# Patient Record
Sex: Male | Born: 1970 | Race: Black or African American | Hispanic: No | Marital: Married | Smoking: Current every day smoker
Health system: Southern US, Community
[De-identification: ages and names within clinical notes are randomized; demographics above are authoritative.]

---

## 2015-06-08 ENCOUNTER — Emergency Department (HOSPITAL_COMMUNITY)
Admission: EM | Admit: 2015-06-08 | Discharge: 2015-06-08 | Disposition: A | Discharge status: Home / Self Care | Payer: Medicaid Other | Attending: Emergency Medicine | Admitting: Emergency Medicine

## 2015-06-08 ENCOUNTER — Encounter (HOSPITAL_COMMUNITY): Payer: Self-pay | Admitting: Emergency Medicine

## 2015-06-08 DIAGNOSIS — K029 Dental caries, unspecified: Secondary | ICD-10-CM | POA: Diagnosis not present

## 2015-06-08 DIAGNOSIS — K0381 Cracked tooth: Secondary | ICD-10-CM | POA: Diagnosis not present

## 2015-06-08 DIAGNOSIS — K047 Periapical abscess without sinus: Secondary | ICD-10-CM | POA: Insufficient documentation

## 2015-06-08 DIAGNOSIS — Z720 Tobacco use: Secondary | ICD-10-CM | POA: Insufficient documentation

## 2015-06-08 DIAGNOSIS — K0889 Other specified disorders of teeth and supporting structures: Secondary | ICD-10-CM | POA: Diagnosis present

## 2015-06-08 MED ORDER — CLINDAMYCIN PHOSPHATE 600 MG/50ML IV SOLN
600.0000 mg | Freq: Once | INTRAVENOUS | Status: AC
  Administered 2015-06-08: 600 mg via INTRAVENOUS
  Filled 2015-06-08: qty 50

## 2015-06-08 MED ORDER — OXYCODONE-ACETAMINOPHEN 5-325 MG PO TABS: 1.0000 | ORAL_TABLET | ORAL | Status: AC | PRN

## 2015-06-08 MED ORDER — OXYCODONE-ACETAMINOPHEN 5-325 MG PO TABS
1.0000 | ORAL_TABLET | Freq: Once | ORAL | Status: AC
  Administered 2015-06-08: 1 via ORAL
  Filled 2015-06-08: qty 1

## 2015-06-08 MED ORDER — CLINDAMYCIN HCL 150 MG PO CAPS: 300.0000 mg | ORAL_CAPSULE | Freq: Four times a day (QID) | ORAL | Status: DC

## 2015-06-08 MED ORDER — CLINDAMYCIN HCL 150 MG PO CAPS: 300.0000 mg | ORAL_CAPSULE | Freq: Four times a day (QID) | ORAL | Status: AC

## 2015-06-08 MED ORDER — OXYCODONE-ACETAMINOPHEN 5-325 MG PO TABS: 1.0000 | ORAL_TABLET | ORAL | Status: DC | PRN

## 2015-06-08 NOTE — Discharge Instructions (Signed)
Dental Abscess A dental abscess is pus in or around a tooth. HOME CARE  Take medicines only as told by your dentist.  If you were prescribed antibiotic medicine, finish all of it even if you start to feel better.  Rinse your mouth (gargle) often with salt water.  Do not drive or use heavy machinery, like a lawn mower, while taking pain medicine.  Do not apply heat to the outside of your mouth.  Keep all follow-up visits as told by your dentist. This is important. GET HELP IF:  Your pain is worse, and medicine does not help. GET HELP RIGHT AWAY IF:  You have a fever or chills.  Your symptoms suddenly get worse.  You have a very bad headache.  You have problems breathing or swallowing.  You have trouble opening your mouth.  You have puffiness (swelling) in your neck or around your eye.   This information is not intended to replace advice given to you by your health care provider. Make sure you discuss any questions you have with your health care provider.   Document Released: 12/17/2014 Document Reviewed: 12/17/2014 Elsevier Interactive Patient Education 2016 [employer]  

## 2015-06-08 NOTE — ED Provider Notes (Signed)
CSN: [id number]     Arrival date & time 06/08/15  0911 History   First MD Initiated Contact with Patient 06/08/15 0913     Chief Complaint  Patient presents with  . Dental Pain     (Consider location/radiation/quality/duration/timing/severity/associated sxs/prior Treatment) HPI   Brent Santos is a 44 y.o. male who presents to the Emergency Department complaining of right lower tooth pain for three days.  He reports having a "broken tooth" for a long time and has not seen a dentist.  Developed throbbing pain and noticed swelling of his face three days ago.  He complains of pain with opening his mouth, and pain radiating into his jaw and along side his nose and upper lip.  He has taken ibuprofen and used Orajel without relief.  He denies fever, difficulty breathing or swallowing, neck pain and chills.   History reviewed. No pertinent past medical history. History reviewed. No pertinent past surgical history. History reviewed. No pertinent family history. Social History  Substance Use Topics  . Smoking status: Current Every Day Smoker  . Smokeless tobacco: None  . Alcohol Use: No    Review of Systems  Constitutional: Negative for fever and appetite change.  HENT: Positive for dental problem. Negative for congestion, facial swelling, sore throat and trouble swallowing.   Eyes: Negative for pain and visual disturbance.  Musculoskeletal: Negative for neck pain and neck stiffness.  Neurological: Negative for dizziness, facial asymmetry and headaches.  Hematological: Negative for adenopathy.  All other systems reviewed and are negative.     Allergies  Review of patient's allergies indicates no known allergies.  Home Medications   Prior to Admission medications   Not on File   BP 134/72 mmHg  Pulse 81  Temp(Src) 98.6 F (37 C) (Oral)  Resp 14  Ht 5\' 11"  (1.803 m)  Wt 190 lb (86.183 kg)  BMI 26.51 kg/m2  SpO2 100% Physical Exam  Constitutional: He is oriented to  person, place, and time. He appears well-developed and well-nourished. No distress.  HENT:  Head: Normocephalic and atraumatic.  Right Ear: Tympanic membrane and ear canal normal.  Left Ear: Tympanic membrane and ear canal normal.  Mouth/Throat: Uvula is midline, oropharynx is clear and moist and mucous membranes are normal. No trismus in the jaw. Dental abscesses and dental caries present. No uvula swelling.  Dental decay and tenderness of the right lower second premolar.  Moderate facial swelling, no obvious dental abscess, no trismus or sublingual abnml.    Neck: Normal range of motion. Neck supple.  Cardiovascular: Normal rate, regular rhythm and normal heart sounds.   No murmur heard. Pulmonary/Chest: Effort normal and breath sounds normal. No respiratory distress.  Musculoskeletal: Normal range of motion.  Lymphadenopathy:    He has no cervical adenopathy.  Neurological: He is alert and oriented to person, place, and time. He exhibits normal muscle tone. Coordination normal.  Skin: Skin is warm and dry.  Nursing note and vitals reviewed.   ED Course  Procedures (including critical care time)   MDM   Final diagnoses:  Dental abscess    Pt is well appearing, vitals stable , airway patent.  No concerns for Ludwig's angina.  Given IV clinda and he agrees to close dental f/u, rx for po clinda and #12 percocet.  Facial edema w/o drainable dental abscess    Brent Ausammy Kahil Agner, PA-C 06/09/15 2056  Jerelyn ScottMartha Linker, MD 06/17/15 [phone]

## 2015-06-08 NOTE — ED Notes (Signed)
Patient complaining of pain and swelling to right lower jaw since Thursday. States he has a tooth that is chipped off in that area.

## 2016-01-04 ENCOUNTER — Encounter (HOSPITAL_COMMUNITY): Payer: Self-pay

## 2016-01-04 ENCOUNTER — Emergency Department (HOSPITAL_COMMUNITY)
Admission: EM | Admit: 2016-01-04 | Discharge: 2016-01-04 | Disposition: A | Discharge status: Home / Self Care | Payer: Medicaid Other | Attending: Emergency Medicine | Admitting: Emergency Medicine

## 2016-01-04 DIAGNOSIS — F17210 Nicotine dependence, cigarettes, uncomplicated: Secondary | ICD-10-CM | POA: Insufficient documentation

## 2016-01-04 DIAGNOSIS — Z791 Long term (current) use of non-steroidal anti-inflammatories (NSAID): Secondary | ICD-10-CM | POA: Diagnosis not present

## 2016-01-04 DIAGNOSIS — K047 Periapical abscess without sinus: Secondary | ICD-10-CM | POA: Insufficient documentation

## 2016-01-04 DIAGNOSIS — K0889 Other specified disorders of teeth and supporting structures: Secondary | ICD-10-CM | POA: Diagnosis present

## 2016-01-04 MED ORDER — AMOXICILLIN 500 MG PO CAPS: 500.0000 mg | ORAL_CAPSULE | Freq: Three times a day (TID) | ORAL | Status: AC

## 2016-01-04 MED ORDER — AMOXICILLIN 250 MG PO CAPS
500.0000 mg | ORAL_CAPSULE | Freq: Once | ORAL | Status: AC
  Administered 2016-01-04: 500 mg via ORAL
  Filled 2016-01-04: qty 2

## 2016-01-04 MED ORDER — HYDROCODONE-ACETAMINOPHEN 5-325 MG PO TABS
1.0000 | ORAL_TABLET | Freq: Once | ORAL | Status: AC
  Administered 2016-01-04: 1 via ORAL
  Filled 2016-01-04: qty 1

## 2016-01-04 MED ORDER — HYDROCODONE-ACETAMINOPHEN 5-325 MG PO TABS: 1.0000 | ORAL_TABLET | ORAL | Status: DC | PRN

## 2016-01-04 NOTE — Discharge Instructions (Signed)
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
## 2016-01-04 NOTE — ED Notes (Signed)
Having dental pain, possible abscess, that started on Friday.

## 2016-01-04 NOTE — ED Provider Notes (Signed)
CSN: [id number]     Arrival date & time 01/04/16  2112 History  By signing my name below, I, Brent Santos, attest that this documentation has been prepared under the direction and in the presence of [name], PA-C.   Electronically Signed: [name], ED Scribe 01/06/2016 at 11:22 PM.   Chief Complaint  Patient presents with  . Dental Problem    The history is provided by the patient. No language interpreter was used.   HPI Comments: [name] is a 45 y.o. male who presents to the Emergency Department complaining of gradual onset, lower right dental pain, ongoing for three days. He states he believes he has an abscess in the area. No other associated symptoms noted. Pt has taken ibuprofen PTA with no relief to symptoms. No other worsening or alleviating factors noted. Pt denies drainage, fever, chills, or any other pertinent symptoms. Pt does not currently have a dentist.   History reviewed. No pertinent past medical history. History reviewed. No pertinent past surgical history. No family history on file. Social History  Substance Use Topics  . Smoking status: Current Every Day Smoker -- 1.00 packs/day    Types: Cigarettes  . Smokeless tobacco: None  . Alcohol Use: No    Review of Systems  Constitutional: Negative for fever and chills.  HENT: Positive for dental problem.        Right lower dental pain. Pt denies drainage.     Allergies  Review of patient's allergies indicates no known allergies.  Home Medications   Prior to Admission medications   Medication Sig Start Date End Date Taking? Authorizing Provider  ibuprofen (ADVIL,MOTRIN) 200 MG tablet Take 400 mg by mouth every 6 (six) hours as needed (dental pain).   Yes Historical Provider, MD  amoxicillin (AMOXIL) 500 MG capsule Take 1 capsule (500 mg total) by mouth 3 (three) times daily. 01/04/16 01/14/16  [name], PA-C  clindamycin (CLEOCIN) 150 MG capsule Take 2 capsules (300 mg total) by mouth 4 (four)  times daily. For 7 days 06/08/15   Tammy Triplett, PA-C  HYDROcodone-acetaminophen (NORCO/VICODIN) 5-325 MG tablet Take 1 tablet by mouth every 4 (four) hours as needed. 01/04/16   [name], PA-C  oxyCODONE-acetaminophen (PERCOCET/ROXICET) 5-325 MG tablet Take 1 tablet by mouth every 4 (four) hours as needed. 06/08/15   Tammy Triplett, PA-C   BP 138/87 mmHg  Pulse 68  Temp(Src) 98.8 F (37.1 C) (Oral)  Resp 18  Ht  (1.803 m)  Wt 185 lb (83.915 kg)  BMI 25.81 kg/m2  SpO2 100% Physical Exam  Constitutional: He is oriented to person, place, and time. He appears well-developed and well-nourished.  HENT:  Head: Normocephalic.  Right Ear: Tympanic membrane normal. Tympanic membrane is not injected, not scarred, not perforated, not erythematous, not retracted and not bulging.  Left Ear: Tympanic membrane normal. Tympanic membrane is not injected, not scarred, not perforated, not erythematous, not retracted and not bulging.  Mouth/Throat: Mucous membranes are not dry. No trismus in the jaw. Abnormal dentition. Dental abscesses present.  Generalized poor dentition. Right lower second premolar is fractured to the gumline. Surrounding gingival edema which extends to lower cheek. No facial erythema. Sublingual space is soft. No trismus. Right submandibular adenopathy noted.   Eyes: EOM are normal.  Neck: Normal range of motion.  Pulmonary/Chest: Effort normal.  Abdominal: He exhibits no distension.  Musculoskeletal: Normal range of motion.  Neurological: He is alert and oriented to person, place, and time.  Psychiatric: He has  a normal mood and affect.  Nursing note and vitals reviewed.   ED Course  Procedures (including critical care time) DIAGNOSTIC STUDIES: Oxygen Saturation is 100% on RA, normal by my interpretation.    COORDINATION OF CARE: 10:22 PM Discussed treatment plan with pt at bedside and pt agreed to plan.   Labs Review Labs Reviewed - No data to display  Imaging  Review No results found.   EKG Interpretation None      MDM   Final diagnoses:  Dental abscess   Amoxil, hydrocodone.  Pt with dental abscess, no trismus, no palpable pus pocket.  F/u with dentistry, referrals given, returning here sooner for any worsened sx.  I personally performed the services described in this documentation, which was scribed in my presence. The recorded information has been reviewed and is accurate.   Burgess AmorJulie Chloeann Alfred, PA-C 01/06/16 2324  Donnetta HutchingBrian Cook, MD 01/08/16 [phone]

## 2019-01-10 ENCOUNTER — Other Ambulatory Visit: Payer: Self-pay

## 2019-01-10 ENCOUNTER — Emergency Department (HOSPITAL_COMMUNITY)
Admission: EM | Admit: 2019-01-10 | Discharge: 2019-01-10 | Disposition: A | Discharge status: Home / Self Care | Payer: Medicaid Other | Attending: Emergency Medicine | Admitting: Emergency Medicine

## 2019-01-10 ENCOUNTER — Emergency Department (HOSPITAL_COMMUNITY): Payer: Medicaid Other

## 2019-01-10 ENCOUNTER — Encounter (HOSPITAL_COMMUNITY): Payer: Self-pay

## 2019-01-10 DIAGNOSIS — S43401A Unspecified sprain of right shoulder joint, initial encounter: Secondary | ICD-10-CM | POA: Diagnosis not present

## 2019-01-10 DIAGNOSIS — Y9389 Activity, other specified: Secondary | ICD-10-CM | POA: Diagnosis not present

## 2019-01-10 DIAGNOSIS — Z23 Encounter for immunization: Secondary | ICD-10-CM | POA: Insufficient documentation

## 2019-01-10 DIAGNOSIS — M25521 Pain in right elbow: Secondary | ICD-10-CM | POA: Diagnosis not present

## 2019-01-10 DIAGNOSIS — Y929 Unspecified place or not applicable: Secondary | ICD-10-CM | POA: Insufficient documentation

## 2019-01-10 DIAGNOSIS — S46911A Strain of unspecified muscle, fascia and tendon at shoulder and upper arm level, right arm, initial encounter: Secondary | ICD-10-CM | POA: Insufficient documentation

## 2019-01-10 DIAGNOSIS — S4991XA Unspecified injury of right shoulder and upper arm, initial encounter: Secondary | ICD-10-CM | POA: Diagnosis not present

## 2019-01-10 DIAGNOSIS — S80811A Abrasion, right lower leg, initial encounter: Secondary | ICD-10-CM | POA: Insufficient documentation

## 2019-01-10 DIAGNOSIS — M25531 Pain in right wrist: Secondary | ICD-10-CM | POA: Diagnosis not present

## 2019-01-10 DIAGNOSIS — Y999 Unspecified external cause status: Secondary | ICD-10-CM | POA: Insufficient documentation

## 2019-01-10 DIAGNOSIS — F17210 Nicotine dependence, cigarettes, uncomplicated: Secondary | ICD-10-CM | POA: Diagnosis not present

## 2019-01-10 DIAGNOSIS — S6991XA Unspecified injury of right wrist, hand and finger(s), initial encounter: Secondary | ICD-10-CM | POA: Diagnosis not present

## 2019-01-10 DIAGNOSIS — S59901A Unspecified injury of right elbow, initial encounter: Secondary | ICD-10-CM | POA: Diagnosis not present

## 2019-01-10 DIAGNOSIS — M25511 Pain in right shoulder: Secondary | ICD-10-CM | POA: Diagnosis not present

## 2019-01-10 DIAGNOSIS — S63501A Unspecified sprain of right wrist, initial encounter: Secondary | ICD-10-CM | POA: Diagnosis not present

## 2019-01-10 MED ORDER — IBUPROFEN 600 MG PO TABS: 600.0000 mg | ORAL_TABLET | Freq: Four times a day (QID) | ORAL | 0 refills | Status: DC | PRN

## 2019-01-10 MED ORDER — BACITRACIN-NEOMYCIN-POLYMYXIN 400-5-5000 EX OINT
TOPICAL_OINTMENT | Freq: Once | CUTANEOUS | Status: AC
  Administered 2019-01-10: 11:00:00 via TOPICAL
  Filled 2019-01-10: qty 2

## 2019-01-10 MED ORDER — ONDANSETRON 4 MG PO TBDP
4.0000 mg | ORAL_TABLET | Freq: Once | ORAL | Status: AC
Start: 2019-01-10 — End: 2019-01-10
  Administered 2019-01-10: 4 mg via ORAL
  Filled 2019-01-10: qty 1

## 2019-01-10 MED ORDER — HYDROCODONE-ACETAMINOPHEN 5-325 MG PO TABS: 1.0000 | ORAL_TABLET | ORAL | 0 refills | Status: AC | PRN

## 2019-01-10 MED ORDER — MORPHINE SULFATE (PF) 4 MG/ML IV SOLN
4.0000 mg | Freq: Once | INTRAVENOUS | Status: AC
  Administered 2019-01-10: 4 mg via INTRAMUSCULAR
  Filled 2019-01-10: qty 1

## 2019-01-10 MED ORDER — TETANUS-DIPHTH-ACELL PERTUSSIS 5-2.5-18.5 LF-MCG/0.5 IM SUSP
0.5000 mL | Freq: Once | INTRAMUSCULAR | Status: AC
  Administered 2019-01-10: 0.5 mL via INTRAMUSCULAR
  Filled 2019-01-10: qty 0.5
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
## 2021-07-19 ENCOUNTER — Other Ambulatory Visit: Payer: Self-pay

## 2021-07-19 ENCOUNTER — Emergency Department (HOSPITAL_COMMUNITY)
Admission: EM | Admit: 2021-07-19 | Discharge: 2021-07-19 | Discharge status: Against Medical Advice | Payer: Medicaid Other | Attending: Emergency Medicine | Admitting: Emergency Medicine

## 2021-07-19 ENCOUNTER — Emergency Department (HOSPITAL_COMMUNITY): Payer: Medicaid Other

## 2021-07-19 DIAGNOSIS — R55 Syncope and collapse: Secondary | ICD-10-CM | POA: Diagnosis not present

## 2021-07-19 DIAGNOSIS — R569 Unspecified convulsions: Secondary | ICD-10-CM | POA: Insufficient documentation

## 2021-07-19 DIAGNOSIS — G40909 Epilepsy, unspecified, not intractable, without status epilepticus: Secondary | ICD-10-CM | POA: Diagnosis not present

## 2021-07-19 DIAGNOSIS — F17210 Nicotine dependence, cigarettes, uncomplicated: Secondary | ICD-10-CM | POA: Diagnosis not present

## 2021-07-19 DIAGNOSIS — R9431 Abnormal electrocardiogram [ECG] [EKG]: Secondary | ICD-10-CM | POA: Diagnosis not present

## 2021-07-19 LAB — BASIC METABOLIC PANEL
Anion gap: 14 (ref 5–15)
BUN: 9 mg/dL (ref 6–20)
CO2: 24 mmol/L (ref 22–32)
Calcium: 9.5 mg/dL (ref 8.9–10.3)
Chloride: 101 mmol/L (ref 98–111)
Creatinine, Ser: 0.63 mg/dL (ref 0.61–1.24)
GFR, Estimated: >60 mL/min (ref >60)
Glucose, Bld: 92 mg/dL (ref 70–99)
Potassium: 4.1 mmol/L (ref 3.5–5.1)
Sodium: 139 mmol/L (ref 135–145)

## 2021-07-19 LAB — CBC
HCT: 41.2 % (ref 39.0–52.0)
Hemoglobin: 14.2 g/dL (ref 13.0–17.0)
MCH: 37.3 pg — ABNORMAL HIGH (ref 26.0–34.0)
MCHC: 34.5 g/dL (ref 30.0–36.0)
MCV: 108.1 fL — ABNORMAL HIGH (ref 80.0–100.0)
Platelets: 263 10*3/uL (ref 150–400)
RBC: 3.81 MIL/uL — ABNORMAL LOW (ref 4.22–5.81)
RDW: 15.1 % (ref 11.5–15.5)
WBC: 4.8 10*3/uL (ref 4.0–10.5)
nRBC: 0 % (ref 0.0–0.2)

## 2021-07-19 LAB — CBG MONITORING, ED: Glucose-Capillary: 121 mg/dL — ABNORMAL HIGH (ref 70–99)

## 2021-07-19 LAB — ETHANOL: Alcohol, Ethyl (B): 12 mg/dL — ABNORMAL HIGH (ref <10)

## 2021-07-19 NOTE — Discharge Instructions (Addendum)
[facility] today to be evaluated for your seizure-like activity.  CT scan of your head showed no acute abnormalities.  Your lab work was reassuring.  You refused to be started on any medications to help with your seizures.  You refused any possible admission for further observation and testing related to your seizures.  It is very important that you contact the neurologist listed on this paperwork and schedule a follow-up appointment.  Get help right away if: You have: A seizure that does not stop after 5 minutes. Several seizures in a row without a complete recovery between seizures. A seizure that makes it harder to breathe. A seizure that leaves you unable to speak or use a part of your body. You do not wake up right away after a seizure. You injure yourself during a seizure. You have confusion or pain right after a seizure.

## 2021-07-19 NOTE — ED Provider Notes (Addendum)
[facility] EMERGENCY DEPARTMENT Provider Note   CSN: [id number] Arrival date & time: 07/19/21  1307     History Chief Complaint  Patient presents with   Seizures    Brent Santos is a 50 y.o. male presents to the emergency department with a chief complaint of seizure-like activity.  Patient states that just prior to arrival in the emergency department he had a syncopal episode.  Patient states that the left third occurred while he was sitting watching TV.  Patient states that he felt hot and started become dizzy prior to losing consciousness.  Syncopal episode was witnessed by patient's wife who reports that patient had full body shaking which lasted approximately 2 minutes.  Patient was confused after this incident but wife was unable to identify exactly how long this postictal state lasted.  Patient states that he had a similar episode "a few weeks prior," episode was again witnessed by his wife.  Patient did not seek any medical attention after previous episode.  Patient denies any pain or discomfort.  No neck pain, back pain, visual disturbance, headache, oral trauma.  Patient denies any illicit drug use.  Endorses drinking 2-3 shots of vodka daily.  Denies any history of seizures.  No new medications.   Seizures     No past medical history on file.  There are no problems to display for this patient.   No past surgical history on file.     No family history on file.  Social History   Tobacco Use   Smoking status: Every Day    Packs/day: 0.50    Types: Cigarettes   Smokeless tobacco: Never  Substance Use Topics   Alcohol use: No   Drug use: No    Home Medications Prior to Admission medications   Medication Sig Start Date End Date Taking? Authorizing Provider  clindamycin (CLEOCIN) 150 MG capsule Take 2 capsules (300 mg total) by mouth 4 (four) times daily. For 7 days 06/08/15   Pauline Aus, PA-C  HYDROcodone-acetaminophen (NORCO/VICODIN) 5-325 MG tablet  Take 1 tablet by mouth every 4 (four) hours as needed. 01/10/19   Jacalyn Lefevre, MD  ibuprofen (ADVIL) 600 MG tablet Take 1 tablet (600 mg total) by mouth every 6 (six) hours as needed. 01/10/19   Jacalyn Lefevre, MD  oxyCODONE-acetaminophen (PERCOCET/ROXICET) 5-325 MG tablet Take 1 tablet by mouth every 4 (four) hours as needed. 06/08/15   Triplett, Tammy, PA-C    Allergies    Patient has no known allergies.  Review of Systems   Review of Systems  Constitutional:  Negative for chills and fever.  HENT:  Negative for facial swelling.   Eyes:  Negative for visual disturbance.  Respiratory:  Negative for shortness of breath.   Cardiovascular:  Negative for chest pain.  Gastrointestinal:  Negative for abdominal pain, nausea and vomiting.  Genitourinary:  Negative for difficulty urinating and dysuria.  Musculoskeletal:  Negative for back pain and neck pain.  Skin:  Negative for color change and rash.  Neurological:  Positive for seizures and syncope. Negative for dizziness, tremors, facial asymmetry, speech difficulty, weakness, light-headedness, numbness and headaches.  Psychiatric/Behavioral:  Negative for confusion.    Physical Exam Updated Vital Signs BP 122/81   Pulse (!) 104   Temp 97.9 F (36.6 C) (Oral)   Resp 17   Wt 59.6 kg   SpO2 100%   BMI 18.34 kg/m   Physical Exam Vitals and nursing note reviewed.  Constitutional:      General: He  is not in acute distress.    Appearance: He is not ill-appearing, toxic-appearing or diaphoretic.  HENT:     Head: Normocephalic and atraumatic. No raccoon eyes, Battle's sign, abrasion, contusion, masses, right periorbital erythema, left periorbital erythema or laceration.  Eyes:     General: No scleral icterus.       Right eye: No discharge.        Left eye: No discharge.     Extraocular Movements: Extraocular movements intact.     Conjunctiva/sclera: Conjunctivae normal.     Pupils: Pupils are equal, round, and reactive to light.   Cardiovascular:     Rate and Rhythm: Normal rate.  Pulmonary:     Effort: Pulmonary effort is normal.  Abdominal:     Palpations: Abdomen is soft.     Tenderness: There is no abdominal tenderness.  Musculoskeletal:     Cervical back: Normal range of motion and neck supple. No edema, erythema, signs of trauma, rigidity, torticollis or crepitus. No pain with movement, spinous process tenderness or muscular tenderness. Normal range of motion.     Comments: No midline tenderness or deformity to cervical, thoracic, or lumbar spine.  Skin:    General: Skin is warm and dry.  Neurological:     General: No focal deficit present.     Mental Status: He is alert and oriented to person, place, and time.     GCS: GCS eye subscore is 4. GCS verbal subscore is 5. GCS motor subscore is 6.     Cranial Nerves: No cranial nerve deficit, dysarthria or facial asymmetry.     Sensory: Sensation is intact.     Motor: No weakness, tremor, seizure activity or pronator drift.     Coordination: Romberg sign negative. Finger-Nose-Finger Test normal.     Gait: Gait is intact. Gait normal.     Comments: CN II-XII intact, equal grip strength, +5 strength to bilateral upper and lower extremities, sensation to light touch intact to bilateral upper and lower extremities.  Psychiatric:        Behavior: Behavior is cooperative.    ED Results / Procedures / Treatments   Labs (all labs ordered are listed, but only abnormal results are displayed) Labs Reviewed  CBC - Abnormal; Notable for the following components:      Result Value   RBC 3.81 (*)    MCV 108.1 (*)    MCH 37.3 (*)    All other components within normal limits  ETHANOL - Abnormal; Notable for the following components:   Alcohol, Ethyl (B) 12 (*)    All other components within normal limits  CBG MONITORING, ED - Abnormal; Notable for the following components:   Glucose-Capillary 121 (*)    All other components within normal limits  BASIC METABOLIC  PANEL    EKG None  Radiology CT Head Wo Contrast  Result Date: 07/19/2021 CLINICAL DATA:  Nontraumatic seizure. EXAM: CT HEAD WITHOUT CONTRAST TECHNIQUE: Contiguous axial images were obtained from the base of the skull through the vertex without intravenous contrast. COMPARISON:  None. FINDINGS: Brain: No evidence of acute infarction, hemorrhage, hydrocephalus, extra-axial collection or mass lesion/mass effect. Vascular: No hyperdense vessel or unexpected calcification. Skull: Normal. Negative for fracture or focal lesion. Sinuses/Orbits: No acute finding. Other: None. IMPRESSION: No acute intracranial abnormality. Electronically Signed   By: Fidela Salisbury M.D.   On: 07/19/2021 16:54    Procedures Procedures   Medications Ordered in ED Medications - No data to display  ED Course  I have reviewed the triage vital signs and the nursing notes.  Pertinent labs & imaging results that were available during my care of the patient were reviewed by me and considered in my medical decision making (see chart for details).    MDM Rules/Calculators/A&P                           Alert 50 year old male no acute distress, nontoxic-appearing.  Presents emergency department with chief complaint of seizure-like activity.  Patient had seizure like activity just prior to arrival in emergency department.  Seizure activity was described as full body convulsions that lasted approximately 2 minutes.  Patient had postictal state.  Patient reports that he had similar episode a few weeks prior but he did not seek any medical attention after this event.  Due to patient's reported seizures will obtain noncontrast head CT, EKG, BMP, CBC, POC CBG, ethanol, and UDS.  EKG shows sinus rhythm POC CBG 121 Ethanol elevated at 12 BMP unremarkable Noncontrast head CT shows no acute intracranial abnormality.  On serial reexamination patient continues to have no focal neurological deficits.  Patient remains alert  and oriented to person, place, and time.  Patient has no further seizure-like activity.  Due to patient's reports of multiple seizures with no previous history will consult neurology.  While awaiting consult with neurology patient states that he would not take any epileptic medication and would not want to be admitted for further work-up.  Importance of antilipid medication was discussed with patient as well as potential negative outcomes of not starting this medication.  Patient expresses understanding of this and continues to refuse any antilipid medication.  Patient requesting to leave the emergency department prior to hearing back from neurology.  Patient will be discharged [place].  Patient was given information to refrain from driving or operating any heavy machinery until he can follow-up with neurology in the outpatient setting.  Patient was given information to follow-up with neurology in outpatient setting.  Importance of neurology follow-up was stressed to patient and patient's wife at bedside.  Discussed results, findings, treatment and follow up. Patient advised of return precautions. Patient verbalized understanding and agreed with plan.  Patient care was discussed with attending physician Dr. Roderic Palau.  Final Clinical Impression(s) / ED Diagnoses Final diagnoses:  Seizure-like activity [facility])    Rx / DC Orders ED Discharge Orders     None        Loni Beckwith, PA-C 07/20/21 0225    Loni Beckwith, PA-C 07/20/21 [id number]    Milton Ferguson, MD 07/21/21 1054
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
## 2021-07-19 NOTE — ED Notes (Signed)
Pt refuses to wear patient gown and to have IV placed, but will allow [place] to place him on the cardiac monitor. Pt reports he will allow [place] to take blood, just not place an IV.

## 2021-07-19 NOTE — ED Notes (Signed)
Pt family comes and gets triage RN from vertical waiting area stating pt felt dizzy. RN arrives to pt not responding, falling out of chair without support. Placed in bed, responsive without convulsions or postictal period within 15 sec. Awake and alert&Ox4 currently. Diaphoretic immediately after event. EKG obtained and given to EDP.

## 2021-07-19 NOTE — ED Notes (Signed)
Attempted to update vitals, pt not in the room at this time. Per the pts family, he stepped outside to the car.

## 2021-07-19 NOTE — ED Notes (Signed)
Pt refused to give urine sample for rapid drug screen.

## 2021-07-20 ENCOUNTER — Telehealth: Payer: Self-pay

## 2021-07-20 NOTE — Telephone Encounter (Signed)
Transition Care Management Follow-up Telephone Call Date of discharge and from where: 07/19/2021-[place]  How have you been since you were released from the hospital? Patient stated he is doing fine. Patient already has a PCP in mind to use going forward and is already making contact.  Any questions or concerns? No  Items Reviewed: Did the pt receive and understand the discharge instructions provided? Yes  Medications obtained and verified?  No new meds given at discharge.  Other? No  Any new allergies since your discharge? No  Dietary orders reviewed? No Do you have support at home? Yes   Home Care and Equipment/Supplies: Were home health services ordered? not applicable If so, what is the name of the agency? N/A  Has the agency set up a time to come to the patient's home? not applicable Were any new equipment or medical supplies ordered?  No What is the name of the medical supply agency? N/A Were you able to get the supplies/equipment? not applicable Do you have any questions related to the use of the equipment or supplies? No  Functional Questionnaire: (I = Independent and D = Dependent) ADLs: I  Bathing/Dressing- I  Meal Prep- I  Eating- I  Maintaining continence- I  Transferring/Ambulation- I  Managing Meds- I  Follow up appointments reviewed:  PCP Hospital f/u appt confirmed? No   Specialist Hospital f/u appt confirmed? No   Are transportation arrangements needed? No  If their condition worsens, is the pt aware to call PCP or go to the Emergency Dept.? Yes Was the patient provided with contact information for the PCP's office or ED? Yes Was to pt encouraged to call back with questions or concerns? Yes

## 2021-11-25 ENCOUNTER — Emergency Department (HOSPITAL_COMMUNITY)

## 2021-11-25 ENCOUNTER — Emergency Department (HOSPITAL_COMMUNITY)
Admission: EM | Admit: 2021-11-25 | Discharge: 2021-11-25 | Disposition: A | Discharge status: Home / Self Care | Attending: Student | Admitting: Student

## 2021-11-25 ENCOUNTER — Other Ambulatory Visit: Payer: Self-pay

## 2021-11-25 ENCOUNTER — Encounter (HOSPITAL_COMMUNITY): Payer: Self-pay

## 2021-11-25 DIAGNOSIS — S0990XA Unspecified injury of head, initial encounter: Secondary | ICD-10-CM | POA: Diagnosis present

## 2021-11-25 DIAGNOSIS — R4020 Unspecified coma: Secondary | ICD-10-CM | POA: Diagnosis not present

## 2021-11-25 DIAGNOSIS — S01512A Laceration without foreign body of oral cavity, initial encounter: Secondary | ICD-10-CM | POA: Diagnosis not present

## 2021-11-25 DIAGNOSIS — S060X0A Concussion without loss of consciousness, initial encounter: Secondary | ICD-10-CM | POA: Diagnosis not present

## 2021-11-25 DIAGNOSIS — R410 Disorientation, unspecified: Secondary | ICD-10-CM | POA: Diagnosis not present

## 2021-11-25 DIAGNOSIS — R58 Hemorrhage, not elsewhere classified: Secondary | ICD-10-CM | POA: Diagnosis not present

## 2021-11-25 DIAGNOSIS — S022XXA Fracture of nasal bones, initial encounter for closed fracture: Secondary | ICD-10-CM | POA: Insufficient documentation

## 2021-11-25 DIAGNOSIS — R609 Edema, unspecified: Secondary | ICD-10-CM | POA: Diagnosis not present

## 2021-11-25 DIAGNOSIS — R040 Epistaxis: Secondary | ICD-10-CM | POA: Diagnosis not present

## 2021-11-25 LAB — CBC WITH DIFFERENTIAL/PLATELET
Abs Immature Granulocytes: 0.02 10*3/uL (ref 0.00–0.07)
Basophils Absolute: 0 10*3/uL (ref 0.0–0.1)
Basophils Relative: 1 %
Eosinophils Absolute: 0.1 10*3/uL (ref 0.0–0.5)
Eosinophils Relative: 1 %
HCT: 40.6 % (ref 39.0–52.0)
Hemoglobin: 13.8 g/dL (ref 13.0–17.0)
Immature Granulocytes: 0 %
Lymphocytes Relative: 33 %
Lymphs Abs: 1.6 10*3/uL (ref 0.7–4.0)
MCH: 34.2 pg — ABNORMAL HIGH (ref 26.0–34.0)
MCHC: 34 g/dL (ref 30.0–36.0)
MCV: 100.7 fL — ABNORMAL HIGH (ref 80.0–100.0)
Monocytes Absolute: 0.4 10*3/uL (ref 0.1–1.0)
Monocytes Relative: 8 %
Neutro Abs: 2.7 10*3/uL (ref 1.7–7.7)
Neutrophils Relative %: 57 %
Platelets: 332 10*3/uL (ref 150–400)
RBC: 4.03 MIL/uL — ABNORMAL LOW (ref 4.22–5.81)
RDW: 12.3 % (ref 11.5–15.5)
WBC: 4.8 10*3/uL (ref 4.0–10.5)
nRBC: 0 % (ref 0.0–0.2)

## 2021-11-25 LAB — BASIC METABOLIC PANEL
Anion gap: 8 (ref 5–15)
BUN: 7 mg/dL (ref 6–20)
CO2: 24 mmol/L (ref 22–32)
Calcium: 8.9 mg/dL (ref 8.9–10.3)
Chloride: 105 mmol/L (ref 98–111)
Creatinine, Ser: 0.87 mg/dL (ref 0.61–1.24)
GFR, Estimated: >60 mL/min (ref >60)
Glucose, Bld: 82 mg/dL (ref 70–99)
Potassium: 4 mmol/L (ref 3.5–5.1)
Sodium: 137 mmol/L (ref 135–145)

## 2021-11-25 LAB — ETHANOL: Alcohol, Ethyl (B): <10 mg/dL (ref <10)

## 2021-11-25 MED ORDER — FENTANYL CITRATE PF 50 MCG/ML IJ SOSY
50.0000 ug | PREFILLED_SYRINGE | Freq: Once | INTRAMUSCULAR | Status: AC
  Administered 2021-11-25: 50 ug via INTRAMUSCULAR
  Filled 2021-11-25: qty 1

## 2021-11-25 MED ORDER — CEPHALEXIN 500 MG PO CAPS: 500.0000 mg | ORAL_CAPSULE | Freq: Two times a day (BID) | ORAL | 0 refills | Status: AC

## 2021-11-25 MED ORDER — LIDOCAINE HCL (PF) 1 % IJ SOLN
5.0000 mL | Freq: Once | INTRAMUSCULAR | Status: AC
Start: 2021-11-25 — End: 2021-11-25
  Administered 2021-11-25: 5 mL
  Filled 2021-11-25: qty 5
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
## 2021-11-25 NOTE — Discharge Instructions (Signed)
Imaging was reassuring, it is possible that you have a concussion, symptoms include a mild headache, brain fog, dizziness, lightheadedness, difficulty concentrating, increase sensitivity to light or noise.  These symptoms will resolve on their own I recommend brain rest i.e. decreasing screen time, vigorous activities and slowly reintroduce them as tolerated.  If your symptoms persist over the weeks time please follow-up with your PCP and/or the concussion clinic for further evaluation you may take over-the-counter pain medication as needed. ? ?Lip laceration-started on antibiotics please take as prescribed, these sutures will dissolve on its own, if you notice increased redness or swelling around the area after 4 days worth of antibiotic use please come back in for reevaluation. ? ?I recommend 24 hours of observation to ensure patient's concussion does not worsen, I would like him to come back in if he has uncontrolled nausea vomiting the worst headache of his life becoming more confused or altered as he will need further evaluation. ? ? ?
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
## 2021-11-26 ENCOUNTER — Telehealth: Payer: Self-pay

## 2021-11-26 NOTE — Telephone Encounter (Signed)
Transition Care Management Unsuccessful Follow-up Telephone Call ? ?Date of discharge and from where:  11/25/2021-[place]  ? ?Attempts:  1st Attempt ? ?Reason for unsuccessful TCM follow-up call:  Missing or invalid number ? ?  ?

## 2021-11-27 NOTE — Telephone Encounter (Signed)
Transition Care Management Unsuccessful Follow-up Telephone Call ? ?Date of discharge and from where:  11/25/2021-[place]  ? ?Attempts:  2nd Attempt ? ?Reason for unsuccessful TCM follow-up call:  Missing or invalid number ? ?  ? ?

## 2021-12-01 ENCOUNTER — Other Ambulatory Visit: Payer: Self-pay

## 2021-12-01 ENCOUNTER — Encounter (HOSPITAL_COMMUNITY): Payer: Self-pay | Admitting: Emergency Medicine

## 2021-12-01 ENCOUNTER — Emergency Department (HOSPITAL_COMMUNITY)
Admission: EM | Admit: 2021-12-01 | Discharge: 2021-12-01 | Attending: Emergency Medicine | Admitting: Emergency Medicine

## 2021-12-01 ENCOUNTER — Emergency Department (HOSPITAL_COMMUNITY)

## 2021-12-01 DIAGNOSIS — S0181XA Laceration without foreign body of other part of head, initial encounter: Secondary | ICD-10-CM | POA: Diagnosis not present

## 2021-12-01 DIAGNOSIS — I959 Hypotension, unspecified: Secondary | ICD-10-CM | POA: Diagnosis not present

## 2021-12-01 DIAGNOSIS — S0990XA Unspecified injury of head, initial encounter: Secondary | ICD-10-CM | POA: Diagnosis not present

## 2021-12-01 DIAGNOSIS — Y92149 Unspecified place in prison as the place of occurrence of the external cause: Secondary | ICD-10-CM | POA: Insufficient documentation

## 2021-12-01 DIAGNOSIS — W01198A Fall on same level from slipping, tripping and stumbling with subsequent striking against other object, initial encounter: Secondary | ICD-10-CM | POA: Diagnosis not present

## 2021-12-01 NOTE — ED Notes (Signed)
Returned from ct 

## 2021-12-01 NOTE — ED Provider Notes (Signed)
?[facility] EMERGENCY DEPARTMENT ?Provider Note ? ? ?CSN: [id number] ?Arrival date & time: 12/01/21  2009 ? ?  ? ?History ? ?Chief Complaint  ?Patient presents with  ? Fall  ? ? ?Brent ChessMicah Alberts is a 51 y.o. male. ? ?Patient fell and hit the back of his head.  No loss of conscious.  No known medical problems ? ?The history is provided by the patient and the police. No language interpreter was used.  ?Fall ?This is a new problem. The current episode started 1 to 2 hours ago. The problem occurs rarely. The problem has been resolved. Associated symptoms include headaches. Pertinent negatives include no chest pain and no abdominal pain. Nothing aggravates the symptoms. Nothing relieves the symptoms. He has tried nothing for the symptoms. The treatment provided no relief.  ? ?  ? ?Home Medications ?Prior to Admission medications   ?Medication Sig Start Date End Date Taking? Authorizing Provider  ?cephALEXin (KEFLEX) 500 MG capsule Take 1 capsule (500 mg total) by mouth 2 (two) times daily for 7 days. ?Patient not taking: Reported on 12/01/2021 11/25/21 12/02/21  Carroll SageFaulkner, William J, PA-C  ?clindamycin (CLEOCIN) 150 MG capsule Take 2 capsules (300 mg total) by mouth 4 (four) times daily. For 7 days ?Patient not taking: Reported on 07/19/2021 06/08/15   Pauline Ausriplett, Tammy, PA-C  ?HYDROcodone-acetaminophen (NORCO/VICODIN) 5-325 MG tablet Take 1 tablet by mouth every 4 (four) hours as needed. ?Patient not taking: Reported on 07/19/2021 01/10/19   Jacalyn LefevreHaviland, Julie, MD  ?oxyCODONE-acetaminophen (PERCOCET/ROXICET) 5-325 MG tablet Take 1 tablet by mouth every 4 (four) hours as needed. ?Patient not taking: Reported on 07/19/2021 06/08/15   Pauline Ausriplett, Tammy, PA-C  ?   ? ?Allergies    ?Patient has no active allergies.   ? ?Review of Systems   ?Review of Systems  ?Constitutional:  Negative for appetite change and fatigue.  ?HENT:  Negative for congestion, ear discharge and sinus pressure.   ?Eyes:  Negative for discharge.  ?Respiratory:  Negative  for cough.   ?Cardiovascular:  Negative for chest pain.  ?Gastrointestinal:  Negative for abdominal pain and diarrhea.  ?Genitourinary:  Negative for frequency and hematuria.  ?Musculoskeletal:  Negative for back pain.  ?Skin:  Negative for rash.  ?Neurological:  Positive for headaches. Negative for seizures.  ?Psychiatric/Behavioral:  Negative for hallucinations.   ? ?Physical Exam ?Updated Vital Signs ?BP 102/77   Pulse 75   Temp 98.7 ?F (37.1 ?C)   Resp 18   Ht 5\' 11"  (1.803 m)   Wt 60 kg   SpO2 94%   BMI 18.45 kg/m?  ?Physical Exam ?Vitals and nursing note reviewed.  ?Constitutional:   ?   Appearance: He is well-developed.  ?HENT:  ?   Head: Normocephalic.  ?   Comments: Patient with 1-1/2 cm laceration to occipital head ?   Nose: Nose normal.  ?Eyes:  ?   General: No scleral icterus. ?   Conjunctiva/sclera: Conjunctivae normal.  ?Neck:  ?   Thyroid: No thyromegaly.  ?Cardiovascular:  ?   Rate and Rhythm: Normal rate and regular rhythm.  ?   Heart sounds: No murmur heard. ?  No friction rub. No gallop.  ?Pulmonary:  ?   Breath sounds: No stridor. No wheezing or rales.  ?Chest:  ?   Chest wall: No tenderness.  ?Abdominal:  ?   General: There is no distension.  ?   Tenderness: There is no abdominal tenderness. There is no rebound.  ?Musculoskeletal:     ?  General: Normal range of motion.  ?   Cervical back: Neck supple.  ?Lymphadenopathy:  ?   Cervical: No cervical adenopathy.  ?Skin: ?   Findings: No erythema or rash.  ?Neurological:  ?   Mental Status: He is alert and oriented to person, place, and time.  ?   Motor: No abnormal muscle tone.  ?   Coordination: Coordination normal.  ?Psychiatric:     ?   Behavior: Behavior normal.  ? ? ?ED Results / Procedures / Treatments   ?Labs ?(all labs ordered are listed, but only abnormal results are displayed) ?Labs Reviewed - No data to display ? ?EKG ?None ? ?Radiology ?CT Head Wo Contrast ? ?Result Date: 12/01/2021 ?CLINICAL DATA:  Head trauma, abnormal mental  status (Age 3-64y); Neck trauma, dangerous injury mechanism (Age 16-64y) EXAM: CT HEAD WITHOUT CONTRAST CT CERVICAL SPINE WITHOUT CONTRAST TECHNIQUE: Multidetector CT imaging of the head and cervical spine was performed following the standard protocol without intravenous contrast. Multiplanar CT image reconstructions of the cervical spine were also generated. RADIATION DOSE REDUCTION: This exam was performed according to the departmental dose-optimization program which includes automated exposure control, adjustment of the mA and/or kV according to patient size and/or use of iterative reconstruction technique. COMPARISON:  CT 11/25/2021 FINDINGS: CT HEAD FINDINGS Brain: No evidence of acute intracranial hemorrhage or extra-axial collection.No evidence of mass lesion/concerning mass effect.The ventricles are normal in size. Vascular: No hyperdense vessel or unexpected calcification. Skull: Negative for skull fracture. Sinuses/Orbits: Minimal ethmoid air cell mucosal thickening. Other: Right lower occipital scalp hematoma with likely laceration (there are small punctate foci of gas). CT CERVICAL SPINE FINDINGS Alignment: Normal. Skull base and vertebrae: No acute fracture. No primary bone lesion or focal pathologic process. Soft tissues and spinal canal: No prevertebral fluid or swelling. No visible canal hematoma. Disc levels: Multilevel degenerative disc disease, moderate at C3-C4 and C4-C5. Mild multilevel facet arthropathy. C1-C2 degenerative changes. Upper chest: Paraseptal emphysema. Other: None. IMPRESSION: No acute intracranial abnormality. Right lower occipital scalp hematoma with probable laceration. No acute cervical spine fracture. Multilevel degenerative disc disease, worst at C3-C4 and C4-C5. Electronically Signed   By: Caprice Renshaw M.D.   On: 12/01/2021 20:56  ? ?CT Cervical Spine Wo Contrast ? ?Result Date: 12/01/2021 ?CLINICAL DATA:  Head trauma, abnormal mental status (Age 41-64y); Neck trauma,  dangerous injury mechanism (Age 74-64y) EXAM: CT HEAD WITHOUT CONTRAST CT CERVICAL SPINE WITHOUT CONTRAST TECHNIQUE: Multidetector CT imaging of the head and cervical spine was performed following the standard protocol without intravenous contrast. Multiplanar CT image reconstructions of the cervical spine were also generated. RADIATION DOSE REDUCTION: This exam was performed according to the departmental dose-optimization program which includes automated exposure control, adjustment of the mA and/or kV according to patient size and/or use of iterative reconstruction technique. COMPARISON:  CT 11/25/2021 FINDINGS: CT HEAD FINDINGS Brain: No evidence of acute intracranial hemorrhage or extra-axial collection.No evidence of mass lesion/concerning mass effect.The ventricles are normal in size. Vascular: No hyperdense vessel or unexpected calcification. Skull: Negative for skull fracture. Sinuses/Orbits: Minimal ethmoid air cell mucosal thickening. Other: Right lower occipital scalp hematoma with likely laceration (there are small punctate foci of gas). CT CERVICAL SPINE FINDINGS Alignment: Normal. Skull base and vertebrae: No acute fracture. No primary bone lesion or focal pathologic process. Soft tissues and spinal canal: No prevertebral fluid or swelling. No visible canal hematoma. Disc levels: Multilevel degenerative disc disease, moderate at C3-C4 and C4-C5. Mild multilevel facet arthropathy. C1-C2 degenerative changes. Upper chest:  Paraseptal emphysema. Other: None. IMPRESSION: No acute intracranial abnormality. Right lower occipital scalp hematoma with probable laceration. No acute cervical spine fracture. Multilevel degenerative disc disease, worst at C3-C4 and C4-C5. Electronically Signed   By: Caprice Renshaw M.D.   On: 12/01/2021 20:56   ? ?Procedures ?Procedures  ? ? ?Medications Ordered in ED ?Medications - No data to display ? ?ED Course/ Medical Decision Making/ A&P ?Patient refuses suturing his 2 cm  laceration in the back of his head.  He will be discharged home ?                        ?Medical Decision Making ?Amount and/or Complexity of Data Reviewed ?Radiology: ordered. ? ?This patient presents to the ED for conce

## 2021-12-01 NOTE — Telephone Encounter (Signed)
Transition Care Management Unsuccessful Follow-up Telephone Call ? ?Date of discharge and from where:  11/25/2021-[facility]  ? ?Attempts:  3rd Attempt ? ?Reason for unsuccessful TCM follow-up call:  Missing or invalid number ? ?  ?

## 2021-12-01 NOTE — ED Triage Notes (Signed)
Pt became belligerent at jail and was forced into cell. Pt tripped on flip flops striking the back of his head. Laceration noted to the back of his head.  ?

## 2021-12-01 NOTE — Discharge Instructions (Signed)
Clean laceration twice a day with soap and water ?

## 2021-12-02 ENCOUNTER — Telehealth: Payer: Self-pay

## 2021-12-02 NOTE — Telephone Encounter (Signed)
Transition Care Management Unsuccessful Follow-up Telephone Call ? ?Date of discharge and from where:  12/01/2021-[place]  ? ?Attempts:  1st Attempt ? ?Reason for unsuccessful TCM follow-up call:  Unable to reach patient ? ?  ?

## 2021-12-03 NOTE — Telephone Encounter (Signed)
Transition Care Management Unsuccessful Follow-up Telephone Call ? ?Date of discharge and from where:  12/01/2021-[place] ? ?Attempts:  2nd Attempt ? ?Reason for unsuccessful TCM follow-up call:  Unable to reach patient ? ?  ?

## 2021-12-07 NOTE — Telephone Encounter (Signed)
Transition Care Management Unsuccessful Follow-up Telephone Call ? ?Date of discharge and from where:  12/01/2021-[place]  ? ?Attempts:  3rd Attempt ? ?Reason for unsuccessful TCM follow-up call:  Unable to reach patient ? ?  ?

## 2022-01-06 ENCOUNTER — Other Ambulatory Visit: Payer: Self-pay

## 2022-01-06 ENCOUNTER — Emergency Department (HOSPITAL_COMMUNITY)

## 2022-01-06 ENCOUNTER — Encounter (HOSPITAL_COMMUNITY): Payer: Self-pay

## 2022-01-06 ENCOUNTER — Emergency Department (HOSPITAL_COMMUNITY)
Admission: EM | Admit: 2022-01-06 | Discharge: 2022-01-06 | Disposition: A | Discharge status: Home / Self Care | Attending: Emergency Medicine | Admitting: Emergency Medicine

## 2022-01-06 DIAGNOSIS — F17210 Nicotine dependence, cigarettes, uncomplicated: Secondary | ICD-10-CM | POA: Diagnosis not present

## 2022-01-06 DIAGNOSIS — L989 Disorder of the skin and subcutaneous tissue, unspecified: Secondary | ICD-10-CM | POA: Diagnosis present

## 2022-01-06 DIAGNOSIS — I96 Gangrene, not elsewhere classified: Secondary | ICD-10-CM | POA: Insufficient documentation

## 2022-01-06 LAB — CBC WITH DIFFERENTIAL/PLATELET
Abs Immature Granulocytes: 0 10*3/uL (ref 0.00–0.07)
Basophils Absolute: 0 10*3/uL (ref 0.0–0.1)
Basophils Relative: 1 %
Eosinophils Absolute: 0.1 10*3/uL (ref 0.0–0.5)
Eosinophils Relative: 2 %
HCT: 36.3 % — ABNORMAL LOW (ref 39.0–52.0)
Hemoglobin: 12.1 g/dL — ABNORMAL LOW (ref 13.0–17.0)
Immature Granulocytes: 0 %
Lymphocytes Relative: 47 %
Lymphs Abs: 1.6 10*3/uL (ref 0.7–4.0)
MCH: 32 pg (ref 26.0–34.0)
MCHC: 33.3 g/dL (ref 30.0–36.0)
MCV: 96 fL (ref 80.0–100.0)
Monocytes Absolute: 0.3 10*3/uL (ref 0.1–1.0)
Monocytes Relative: 10 %
Neutro Abs: 1.3 10*3/uL — ABNORMAL LOW (ref 1.7–7.7)
Neutrophils Relative %: 40 %
Platelets: 423 10*3/uL — ABNORMAL HIGH (ref 150–400)
RBC: 3.78 MIL/uL — ABNORMAL LOW (ref 4.22–5.81)
RDW: 12.7 % (ref 11.5–15.5)
WBC: 3.3 10*3/uL — ABNORMAL LOW (ref 4.0–10.5)
nRBC: 0 % (ref 0.0–0.2)

## 2022-01-06 LAB — BASIC METABOLIC PANEL
Anion gap: 6 (ref 5–15)
BUN: 12 mg/dL (ref 6–20)
CO2: 26 mmol/L (ref 22–32)
Calcium: 8.8 mg/dL — ABNORMAL LOW (ref 8.9–10.3)
Chloride: 104 mmol/L (ref 98–111)
Creatinine, Ser: 1 mg/dL (ref 0.61–1.24)
GFR, Estimated: >60 mL/min (ref >60)
Glucose, Bld: 94 mg/dL (ref 70–99)
Potassium: 3.9 mmol/L (ref 3.5–5.1)
Sodium: 136 mmol/L (ref 135–145)

## 2022-01-06 LAB — CBG MONITORING, ED: Glucose-Capillary: 94 mg/dL (ref 70–99)

## 2022-01-06 NOTE — ED Provider Notes (Signed)
[facility] EMERGENCY DEPARTMENT Provider Note   CSN: [id number] Arrival date & time: 01/06/22  0809     History  Chief Complaint  Patient presents with   Finger Injury    Brent Santos is a 51 y.o. male.  HPI Adult male presents accompanied by corrections facility officer.  Patient presents due to concern for worsening lesion of his left ring finger.  Unclear exact onset, but the patient is currently on Keflex and Bactrim, but continued to have worsening discharge, erythema and swelling of the left fourth digit.  No fever, chills, other complaints, pain is present, worse with motion, not improved with anything.  With concern for worsening infection despite of oral antibiotics was brought here for evaluation.    Home Medications Prior to Admission medications   Medication Sig Start Date End Date Taking? Authorizing Provider  clindamycin (CLEOCIN) 150 MG capsule Take 2 capsules (300 mg total) by mouth 4 (four) times daily. For 7 days Patient not taking: Reported on 07/19/2021 06/08/15   Pauline Aus, PA-C  HYDROcodone-acetaminophen (NORCO/VICODIN) 5-325 MG tablet Take 1 tablet by mouth every 4 (four) hours as needed. Patient not taking: Reported on 07/19/2021 01/10/19   Jacalyn Lefevre, MD  oxyCODONE-acetaminophen (PERCOCET/ROXICET) 5-325 MG tablet Take 1 tablet by mouth every 4 (four) hours as needed. Patient not taking: Reported on 07/19/2021 06/08/15   Pauline Aus, PA-C      Allergies    Patient has no known allergies.    Review of Systems   Review of Systems  All other systems reviewed and are negative.  Physical Exam Updated Vital Signs BP 118/83   Pulse (!) 57   Temp 98.2 F (36.8 C) (Oral)   Resp 18   Ht 5\' 7"  (1.702 m)   Wt 72.6 kg   SpO2 100%   BMI 25.06 kg/m  Physical Exam Vitals and nursing note reviewed.  Constitutional:      General: He is not in acute distress.    Appearance: He is well-developed.  HENT:     Head: Normocephalic and atraumatic.   Eyes:     Conjunctiva/sclera: Conjunctivae normal.  Cardiovascular:     Rate and Rhythm: Normal rate and regular rhythm.     Pulses: Normal pulses.  Pulmonary:     Effort: Pulmonary effort is normal. No respiratory distress.     Breath sounds: No stridor.  Abdominal:     General: There is no distension.  Musculoskeletal:       Arms:     Comments: See photos accompanying.  Skin:    General: Skin is warm and dry.  Neurological:     Mental Status: He is alert and oriented to person, place, and time.       ED Results / Procedures / Treatments   Labs (all labs ordered are listed, but only abnormal results are displayed) Labs Reviewed  BASIC METABOLIC PANEL - Abnormal; Notable for the following components:      Result Value   Calcium 8.8 (*)    All other components within normal limits  CBC WITH DIFFERENTIAL/PLATELET - Abnormal; Notable for the following components:   WBC 3.3 (*)    RBC 3.78 (*)    Hemoglobin 12.1 (*)    HCT 36.3 (*)    Platelets 423 (*)    Neutro Abs 1.3 (*)    All other components within normal limits  CBG MONITORING, ED    EKG None  Radiology DG Finger Ring Left  Result Date: 01/06/2022 CLINICAL  DATA:  Wound at the tip of the finger, osteomyelitis suspected EXAM: LEFT RING FINGER 2+V COMPARISON:  None Available. FINDINGS: There may be a nondisplaced, transverse fracture of the base of the left second distal phalanx with some resorptive lucency. No other evidence of fracture. Soft tissue wound of the tip of the digit open to bone, however without gross bony erosion or sclerosis of the tuft. Diffuse soft tissue edema about the digit. IMPRESSION: 1. There may be a nondisplaced, transverse fracture of the base of the left second distal phalanx with some resorptive lucency suggesting subacute chronicity. No other evidence of fracture. 2. Soft tissue wound of the tip of the digit open to bone, however without gross bony erosion or sclerosis of the tuft. 3.  Diffuse soft tissue edema about the digit. Electronically Signed   By: Jearld Lesch M.D.   On: 01/06/2022 09:16    Procedures Procedures    Medications Ordered in ED Medications - No data to display  ED Course/ Medical Decision Making/ A&P This patient with a Hx of cigarette smoking, incarceration presents to the ED for concern of infected digit, this involves an extensive number of treatment options, and is a complaint that carries with it a high risk of complications and morbidity.    The differential diagnosis includes osteomyelitis, bacteremia, sepsis   Social Determinants of Health:  Cigarette use, incarceration  Additional history obtained:  Additional history and/or information obtained from [employer] officer, paperwork from the jail nurse, notable for ongoing therapeutic efforts to control finger infection   After the initial evaluation, orders, including: X-ray, labs were initiated.   The patient was also maintained on pulse oximetry. The readings were typically 100% room air normal   On repeat evaluation of the patient improved  Lab Tests:  I personally interpreted labs.  The pertinent results include: Evidence for bacteremia/sepsis  Imaging Studies ordered:  I independently visualized and interpreted imaging which showed no osteomyelitis, though there is clear evidence for infection I agree with the radiologist interpretation  Consultations Obtained:  I requested consultation with the orthopedist, hand surgeon, Ortho PA,  and discussed lab and imaging findings as well as pertinent plan - they recommend: With Dr. Romeo Apple, PA Tinnie Gens, and through him Dr. Orlan Leavens; no indication for emergent intervention, though the patient is likely to have amputation of all or most of that digit next week is appropriate to do this as an outpatient.  Dispostion / Final MDM:  After consideration of the diagnostic results and the patient's response to treatment, patient will be  discharged with outpatient orthopedics follow-up for likely amputation of his digit.  Here, patient is awake, alert, hemodynamically unremarkable there is no evidence for bacteremia, sepsis, and the patient is already on antibiotics which she will continue pending outpatient follow-up.  Though hospitalization was a consideration given the necrosis on his digit, absent systemic illness, patient appropriate for outpatient follow-up.  This was discussed at length with the sheriff's deputy who, the patient, and nursing staff at the facility will facilitate this.  Final Clinical Impression(s) / ED Diagnoses Final diagnoses:  Necrosis of finger (HCC)     Gerhard Munch, MD 01/06/22 1525

## 2022-01-06 NOTE — Discharge Instructions (Signed)
As discussed, with your finger infection it is important to continue taking your antibiotics as prescribed, and follow-up with our hand surgeon.  Our hand surgeon is expecting a call to perform additional care in about the next week.  Return here for concerning changes in your condition.

## 2022-01-06 NOTE — ED Triage Notes (Signed)
Pt presents to ED from [facility] with complaints of left ring finger swelling and wound. Pt states has been going on for over a month, nurse has been treating with a solution. Finger noted to be black to tip of finger. Pt denies injury.

## 2022-01-07 ENCOUNTER — Telehealth: Payer: Self-pay

## 2022-01-07 NOTE — Telephone Encounter (Signed)
Transition Care Management Unsuccessful Follow-up Telephone Call  Date of discharge and from where:  01/06/2022 from [facility]  Attempts:  1st Attempt  Reason for unsuccessful TCM follow-up call:  Unable to reach patient

## 2022-01-08 NOTE — Telephone Encounter (Signed)
Transition Care Management Unsuccessful Follow-up Telephone Call  Date of discharge and from where:  01/06/2022 from [facility]    Attempts:  2nd Attempt  Reason for unsuccessful TCM follow-up call:  Unable to reach patient

## 2022-01-12 NOTE — Telephone Encounter (Signed)
Transition Care Management Unsuccessful Follow-up Telephone Call  Date of discharge and from where:  01/06/2022 from [place]  Attempts:  3rd Attempt  Reason for unsuccessful TCM follow-up call:  Unable to reach patient    

## 2022-01-14 DIAGNOSIS — L03012 Cellulitis of left finger: Secondary | ICD-10-CM | POA: Diagnosis not present

## 2022-05-06 DIAGNOSIS — F4320 Adjustment disorder, unspecified: Secondary | ICD-10-CM | POA: Diagnosis not present

## 2022-08-02 DIAGNOSIS — F4320 Adjustment disorder, unspecified: Secondary | ICD-10-CM | POA: Diagnosis not present

## 2023-01-17 IMAGING — CT CT HEAD W/O CM
4 series · 15 of 47 positions shown, 17 images · non-contrast
Comparison: CT 11/25/2021

CLINICAL DATA: Head trauma, abnormal mental status (Age 18-64y);
Neck trauma, dangerous injury mechanism (Age 16-64y)



[Series 3: head w o · axial · 0.45mm/px · z∈[+60,+180]mm · 7 of 34 slices shown, 9 images]
[im 5/34  brain]
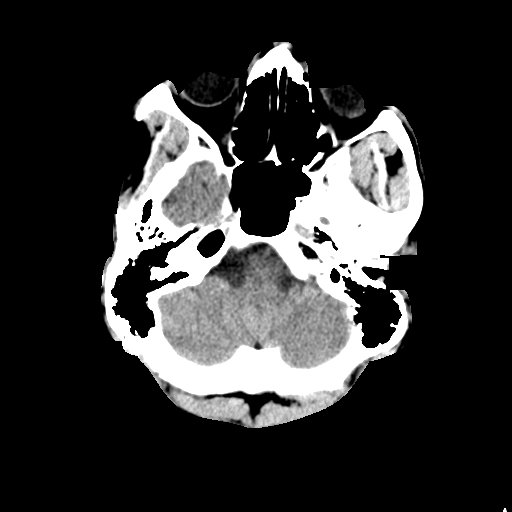
[im 5/34  bone]
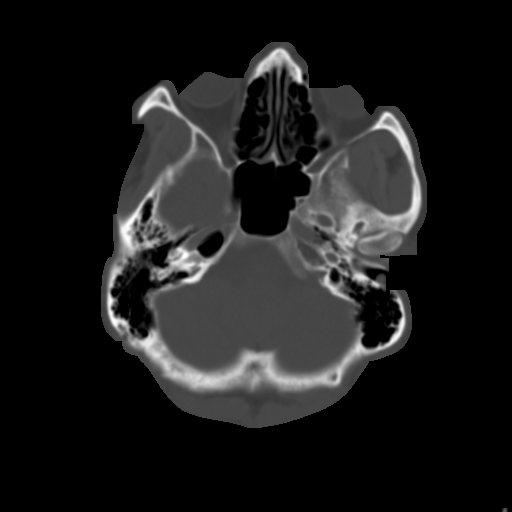
[im 9/34  brain]
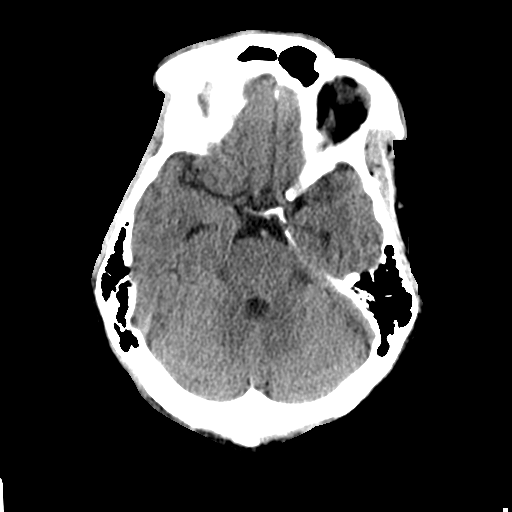
[im 13/34  brain]
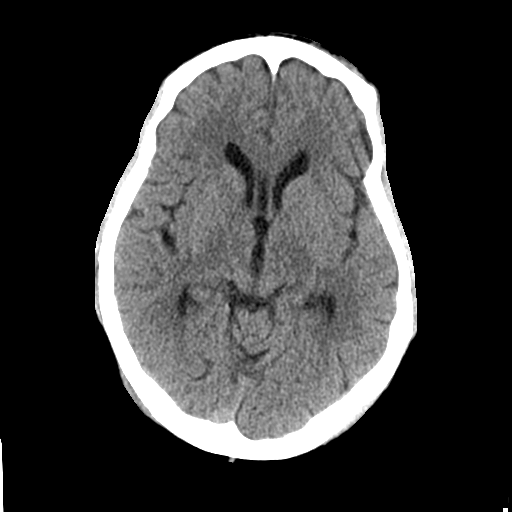
[im 17/34  brain]
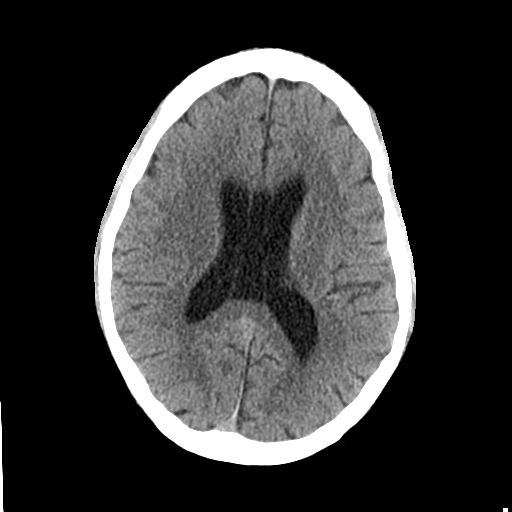
[im 21/34  brain]
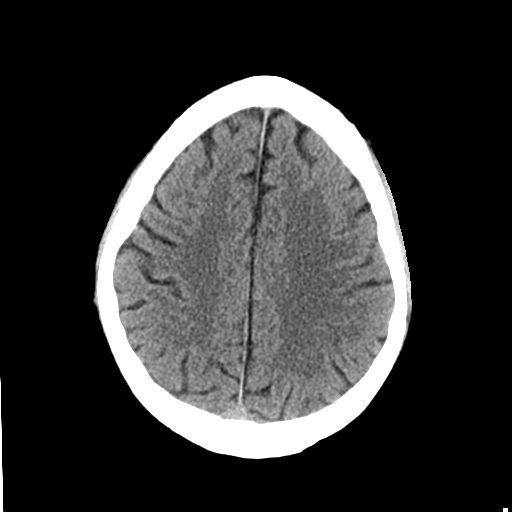
[im 21/34  bone]
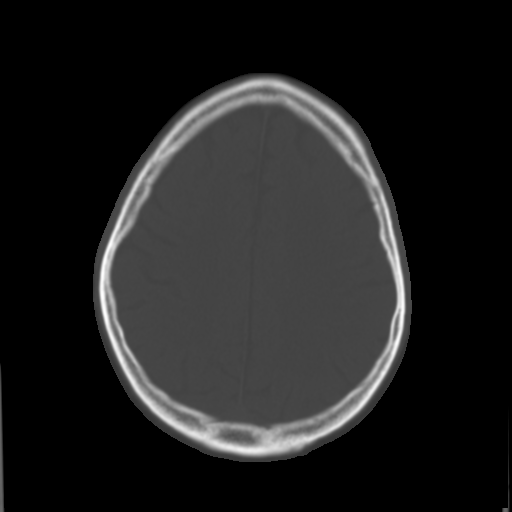
[im 25/34  brain]
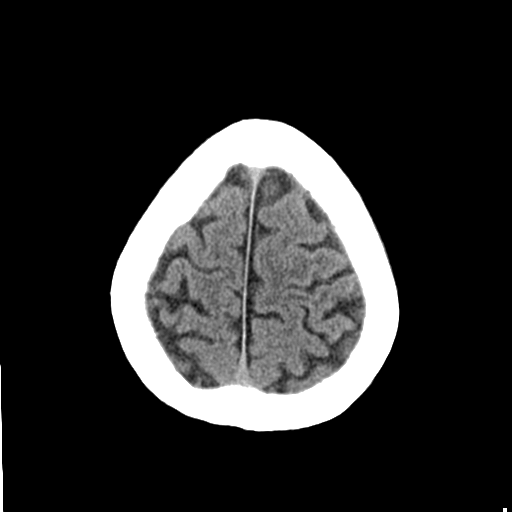
[im 29/34  brain]
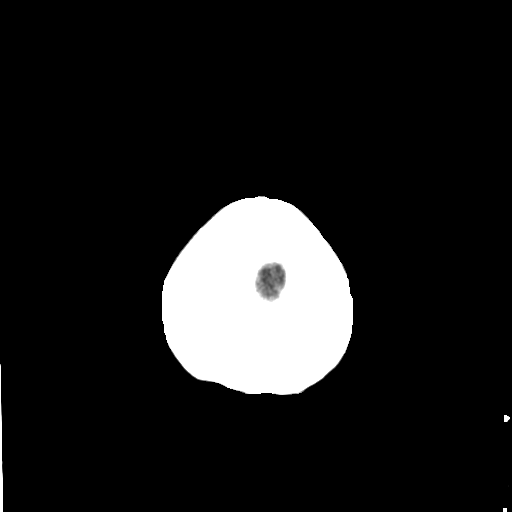

[Series 4: head bone · axial · 0.45mm/px · z∈[+56,+72]mm · 2 of 84 slices shown]
[im 9/84  bone]
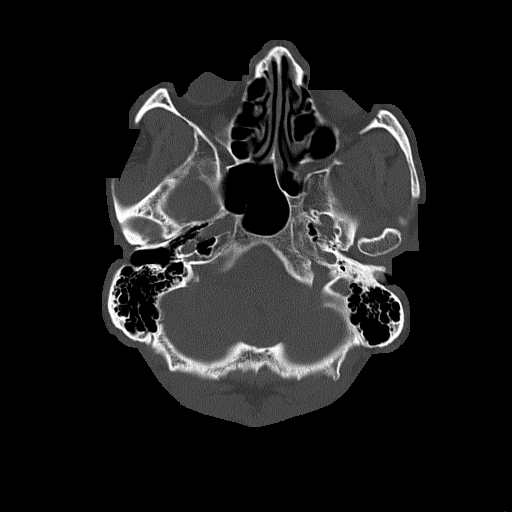
[im 17/84  bone]
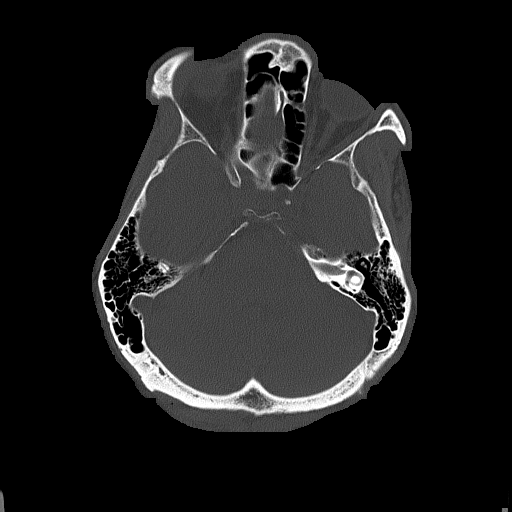

[Series 5: coronal soft · coronal · 0.33mm/px · 3 of 72 slices shown]
[im 24/72  brain]
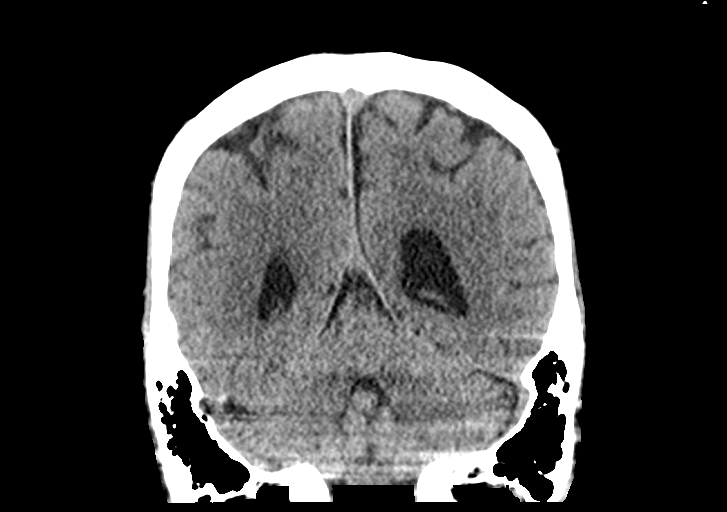
[im 32/72  brain]
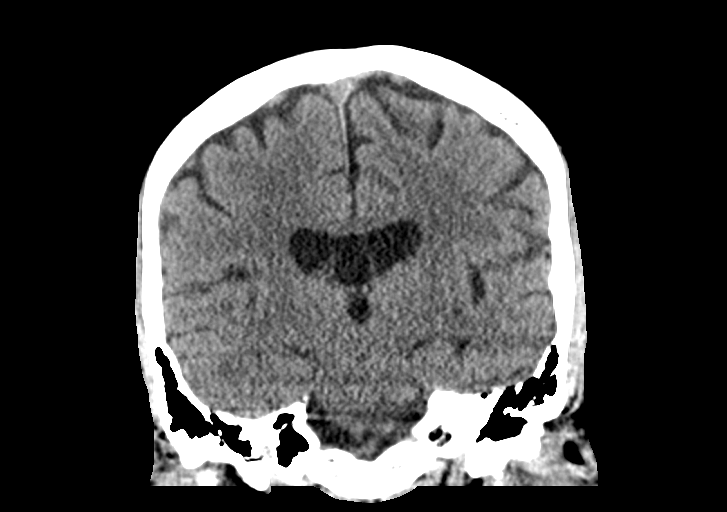
[im 40/72  brain]
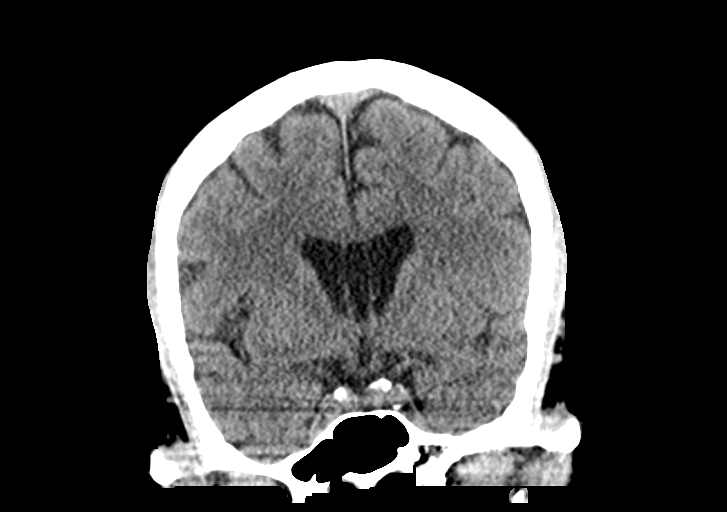

[Series 6: sagittal soft · sagittal · 0.36mm/px · 3 of 67 slices shown]
[im 23/67  brain]
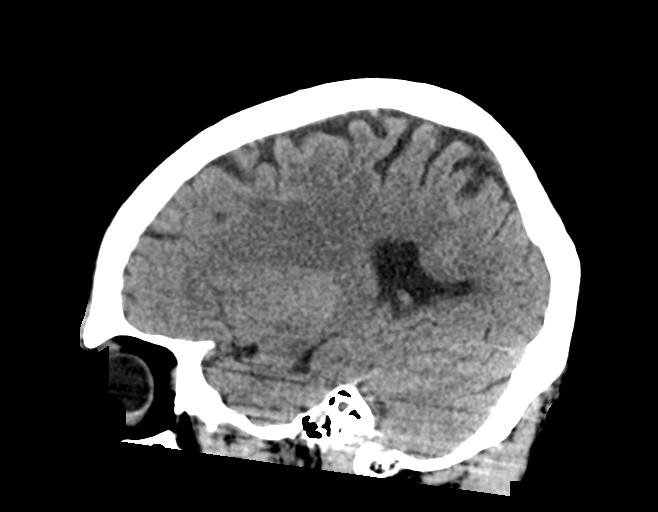
[im 34/67  brain]
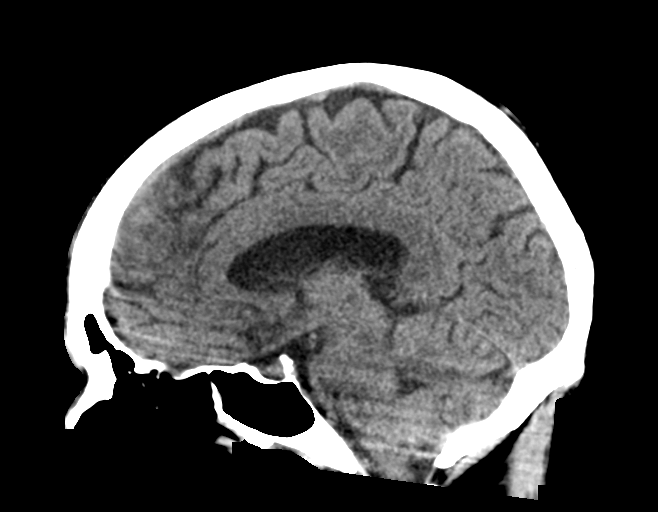
[im 45/67  brain]
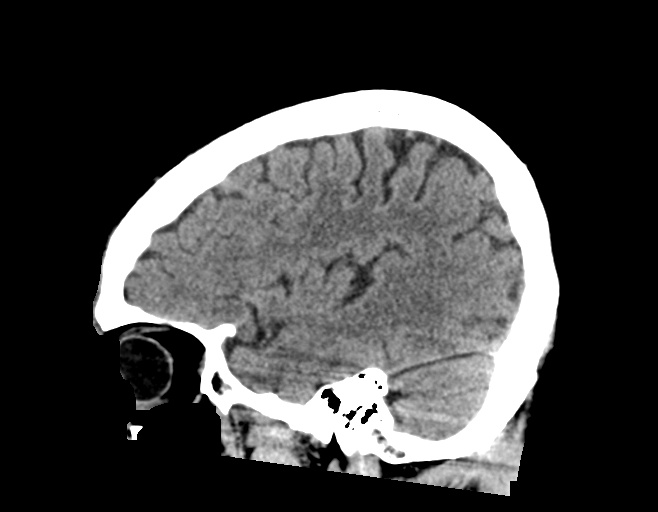

[15 of 47 positions shown; findings below may reference images not displayed]

FINDINGS: CT HEAD FINDINGS

Brain: No evidence of acute intracranial hemorrhage or extra-axial
collection.No evidence of mass lesion/concerning mass effect.The
ventricles are normal in size.

Vascular: No hyperdense vessel or unexpected calcification.

Skull: Negative for skull fracture.

Sinuses/Orbits: Minimal ethmoid air cell mucosal thickening.

Other: Right lower occipital scalp hematoma with likely laceration
(there are small punctate foci of gas).

CT CERVICAL SPINE FINDINGS

Alignment: Normal.

Skull base and vertebrae: No acute fracture. No primary bone lesion
or focal pathologic process.

Soft tissues and spinal canal: No prevertebral fluid or swelling. No
visible canal hematoma.

Disc levels: Multilevel degenerative disc disease, moderate at C3-C4
and C4-C5. Mild multilevel facet arthropathy. C1-C2 degenerative
changes.

Upper chest: Paraseptal emphysema.

Other: None.
IMPRESSION: No acute intracranial abnormality.

Right lower occipital scalp hematoma with probable laceration.

No acute cervical spine fracture.

Multilevel degenerative disc disease, worst at C3-C4 and C4-C5.

## 2023-02-22 IMAGING — DX DG FINGER RING 2+V*L*
3 series · 3 of 3 positions shown · non-contrast
Comparison: None Available.

CLINICAL DATA: Wound at the tip of the finger, osteomyelitis
suspected

EXAM:
LEFT RING FINGER 2+V

[finger ap]
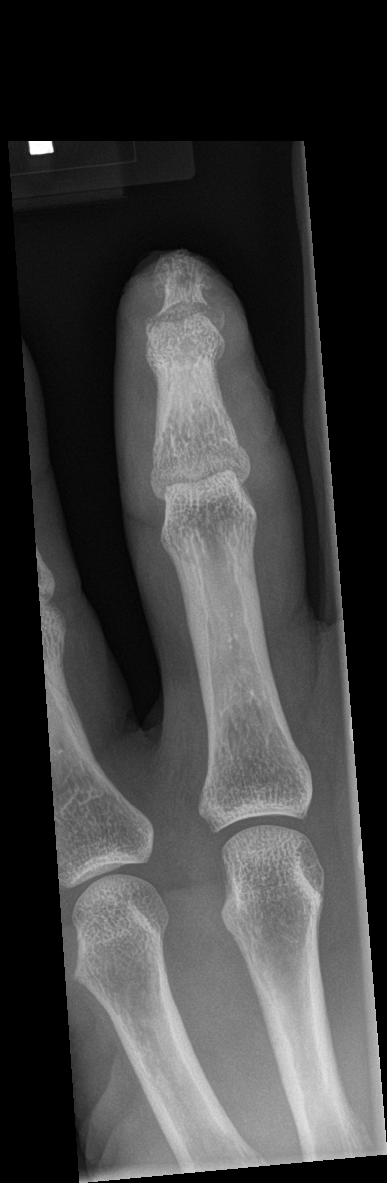

[finger obl]
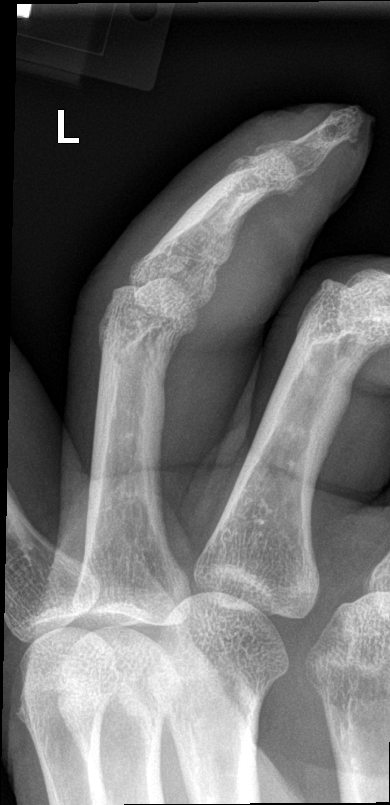

[finger lat]
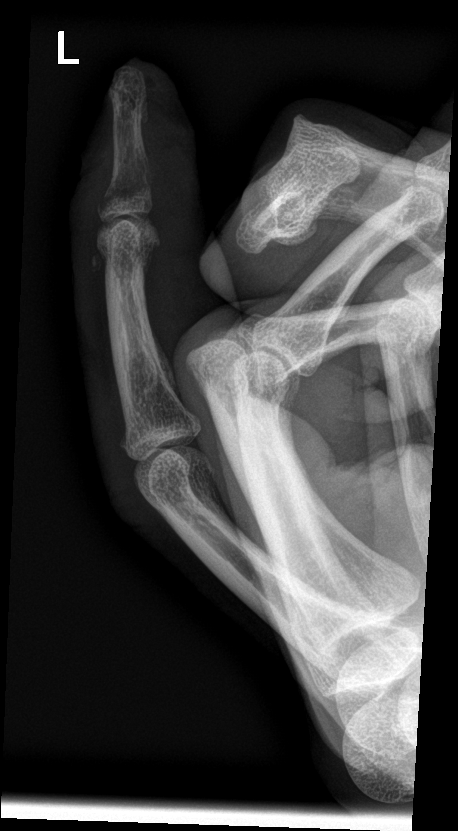

[3 of 3 positions shown; findings below may reference images not displayed]

FINDINGS: There may be a nondisplaced, transverse fracture of the base of the
left second distal phalanx with some resorptive lucency. No other
evidence of fracture. Soft tissue wound of the tip of the digit open
to bone, however without gross bony erosion or sclerosis of the
tuft. Diffuse soft tissue edema about the digit.
IMPRESSION: 1. There may be a nondisplaced, transverse fracture of the base of
the left second distal phalanx with some resorptive lucency
suggesting subacute chronicity. No other evidence of fracture.
2. Soft tissue wound of the tip of the digit open to bone, however
without gross bony erosion or sclerosis of the tuft.
3. Diffuse soft tissue edema about the digit.
